# Patient Record
Sex: Female | Born: 1980 | Race: White | Hispanic: No | Marital: Married | Smoking: Never smoker
Health system: Southern US, Community
[De-identification: ages and names within clinical notes are randomized; demographics above are authoritative.]

---

## 2015-12-29 ENCOUNTER — Emergency Department (HOSPITAL_BASED_OUTPATIENT_CLINIC_OR_DEPARTMENT_OTHER)
Admission: EM | Admit: 2015-12-29 | Discharge: 2015-12-29 | Disposition: A | Discharge status: Home / Self Care | Payer: 59 | Attending: Emergency Medicine | Admitting: Emergency Medicine

## 2015-12-29 ENCOUNTER — Emergency Department (HOSPITAL_BASED_OUTPATIENT_CLINIC_OR_DEPARTMENT_OTHER): Payer: 59

## 2015-12-29 ENCOUNTER — Encounter (HOSPITAL_BASED_OUTPATIENT_CLINIC_OR_DEPARTMENT_OTHER): Payer: Self-pay | Admitting: *Deleted

## 2015-12-29 DIAGNOSIS — S62639B Displaced fracture of distal phalanx of unspecified finger, initial encounter for open fracture: Secondary | ICD-10-CM

## 2015-12-29 DIAGNOSIS — Y998 Other external cause status: Secondary | ICD-10-CM | POA: Diagnosis not present

## 2015-12-29 DIAGNOSIS — Y9289 Other specified places as the place of occurrence of the external cause: Secondary | ICD-10-CM | POA: Insufficient documentation

## 2015-12-29 DIAGNOSIS — S62660B Nondisplaced fracture of distal phalanx of right index finger, initial encounter for open fracture: Secondary | ICD-10-CM | POA: Insufficient documentation

## 2015-12-29 DIAGNOSIS — S61310A Laceration without foreign body of right index finger with damage to nail, initial encounter: Secondary | ICD-10-CM | POA: Insufficient documentation

## 2015-12-29 DIAGNOSIS — Y9389 Activity, other specified: Secondary | ICD-10-CM | POA: Diagnosis not present

## 2015-12-29 DIAGNOSIS — S61219A Laceration without foreign body of unspecified finger without damage to nail, initial encounter: Secondary | ICD-10-CM

## 2015-12-29 DIAGNOSIS — W290XXA Contact with powered kitchen appliance, initial encounter: Secondary | ICD-10-CM | POA: Insufficient documentation

## 2015-12-29 DIAGNOSIS — S6991XA Unspecified injury of right wrist, hand and finger(s), initial encounter: Secondary | ICD-10-CM | POA: Diagnosis present

## 2015-12-29 MED ORDER — HYDROCODONE-ACETAMINOPHEN 5-325 MG PO TABS: ORAL_TABLET | ORAL | Status: AC

## 2015-12-29 MED ORDER — LIDOCAINE HCL 2 % IJ SOLN
20.0000 mL | Freq: Once | INTRAMUSCULAR | Status: AC
  Administered 2015-12-29: 400 mg via INTRADERMAL
  Filled 2015-12-29: qty 20

## 2015-12-29 MED ORDER — ONDANSETRON 8 MG PO TBDP
8.0000 mg | ORAL_TABLET | Freq: Once | ORAL | Status: AC
  Administered 2015-12-29: 8 mg via ORAL
  Filled 2015-12-29: qty 1

## 2015-12-29 MED ORDER — CEPHALEXIN 250 MG PO CAPS
500.0000 mg | ORAL_CAPSULE | Freq: Once | ORAL | Status: AC
  Administered 2015-12-29: 500 mg via ORAL
  Filled 2015-12-29: qty 2

## 2015-12-29 MED ORDER — OXYCODONE-ACETAMINOPHEN 5-325 MG PO TABS
1.0000 | ORAL_TABLET | Freq: Once | ORAL | Status: AC
  Administered 2015-12-29: 1 via ORAL
  Filled 2015-12-29: qty 1

## 2015-12-29 MED ORDER — CEPHALEXIN 500 MG PO CAPS: 500.0000 mg | ORAL_CAPSULE | Freq: Four times a day (QID) | ORAL | Status: AC

## 2015-12-29 MED ORDER — HYDROCODONE-ACETAMINOPHEN 5-325 MG PO TABS
1.0000 | ORAL_TABLET | Freq: Once | ORAL | Status: AC
  Administered 2015-12-29: 1 via ORAL
  Filled 2015-12-29: qty 1

## 2015-12-29 NOTE — Discharge Instructions (Signed)
Please read and follow all provided instructions.  Your diagnoses today include:  1. Open fracture of tuft of distal phalanx of finger, initial encounter   2. Laceration of finger, initial encounter    Tests performed today include:  X-ray of the affected area showed a broken bone in the tip of your index finger  Vital signs. See below for your results today.   Medications prescribed:   Keflex (cephalexin) - antibiotic  You have been prescribed an antibiotic medicine: take the entire course of medicine even if you are feeling better. Stopping early can cause the antibiotic not to work.   Vicodin (hydrocodone/acetaminophen) - narcotic pain medication  DO NOT drive or perform any activities that require you to be awake and alert because this medicine can make you drowsy. BE VERY CAREFUL not to take multiple medicines containing Tylenol (also called acetaminophen). Doing so can lead to an overdose which can damage your liver and cause liver failure and possibly death.  Take any prescribed medications only as directed.   Home care instructions:  Follow any educational materials and wound care instructions contained in this packet.   Keep affected area above the level of your heart when possible to minimize swelling. Wash area gently twice a day with warm soapy water. Do not apply alcohol or hydrogen peroxide. Cover the area if it draining or weeping.   Follow-up instructions: Follow-up with Dr. Izora Ribas for a recheck given the severity of your wound.    Return instructions:  Return to the Emergency Department if you have:  Fever  Worsening pain  Worsening swelling of the wound  Pus draining from the wound  Redness of the skin that moves away from the wound, especially if it streaks away from the affected area   Any other emergent concerns  Your vital signs today were: BP 136/91 mmHg   Pulse 100   Temp(Src) 98.6 F (37 C) (Oral)   Resp 18   Ht  (1.575 m)   Wt 77.111 kg    BMI 31.09 kg/m2   SpO2 100%   LMP 12/25/2015 If your blood pressure (BP) was elevated above 135/85 this visit, please have this repeated by your doctor within one month. --------------

## 2015-12-29 NOTE — ED Notes (Signed)
Pt reports she put her hand in blender- laceration to right index and right middle fingertips

## 2015-12-29 NOTE — ED Provider Notes (Addendum)
CSN: [id number]     Arrival date & time 12/29/15  1652 History   First MD Initiated Contact with Patient 12/29/15 1830     Chief Complaint  Patient presents with  . Finger Injury     (Consider location/radiation/quality/duration/timing/severity/associated sxs/prior Treatment) HPI Comments: Patient presents with lacerations to the right index and long fingertips coming approximately 4:15 PM while the patient was using a blender to blend baby food. Wound immediately bled. Wound was covered and patient was transported to the emergency department. No other treatments prior to arrival. Last tetanus update in 2011. Patient denies numbness or tingling. She complains of pain in the area of laceration, especially the index finger where the wound is worse. Onset of symptoms acute. Course is constant. Nothing makes symptoms better or worse.   The history is provided by the patient and a friend.    History reviewed. No pertinent past medical history. Past Surgical History  Procedure Laterality Date  . Cesarean section     No family history on file. Social History  Substance Use Topics  . Smoking status: Never Smoker   . Smokeless tobacco: Never Used  . Alcohol Use: No   OB History    No data available     Review of Systems  Constitutional: Positive for activity change.  Musculoskeletal: Positive for arthralgias. Negative for back pain, joint swelling and neck pain.  Skin: Positive for wound.  Neurological: Negative for weakness and numbness.      Allergies  Review of patient's allergies indicates no known allergies.  Home Medications   Prior to Admission medications   Not on File   BP 136/91 mmHg  Pulse 100  Temp(Src) 98.6 F (37 C) (Oral)  Resp 18  Ht  (1.575 m)  Wt 77.111 kg  BMI 31.09 kg/m2  SpO2 100%  LMP 12/25/2015   Physical Exam  Constitutional: She appears well-developed and well-nourished.  HENT:  Head: Normocephalic and atraumatic.  Eyes: Conjunctivae  are normal.  Neck: Normal range of motion. Neck supple.  Pulmonary/Chest: No respiratory distress.  Musculoskeletal: She exhibits tenderness.       Right wrist: Normal.       Right hand: She exhibits tenderness. She exhibits normal range of motion. Normal strength noted.       Hands: R index finger: Deep laceration noted to the middle of the finger pad, involving part of the nail plate. Mild oozing of blood, wound appears clean. Base explored. Wound extends to bone. Patient has full flexion at the MCP, PIP. She has reasonable ability to flex and good strength with flexion at the DIP as well. No lidocaine observed to be flowing through the wound during digital block. Finger pad distal to the wound appears to be reasonably perfused.   Right long finger: Superficial lacerations to the tip, not requiring repair. Full range of motion of the finger.  Neurological: She is alert.  Skin: Skin is warm and dry.  Psychiatric: She has a normal mood and affect.  Nursing note and vitals reviewed.   ED Course  Procedures (including critical care time) Labs Review Labs Reviewed - No data to display  Imaging Review Dg Hand Complete Right  12/29/2015  CLINICAL DATA:  Laceration to the index and middle fingers, put hand in blander. EXAM: RIGHT HAND - COMPLETE 3+ VIEW COMPARISON:  None. FINDINGS: Nondisplaced fracture of the index finger distal tuft. No additional acute fracture. Probable soft tissue injury to the distal index finger, no radiopaque foreign body.  IMPRESSION: Nondisplaced distal tuft fracture of the index finger with probable associated soft tissue injury. No radiopaque foreign body. Electronically Signed   By: Rubye Oaks M.D.   On: 12/29/2015 18:14   I have personally reviewed and evaluated these images and lab results as part of my medical decision-making.   EKG Interpretation None       Patient seen and examined. Work-up initiated. Medications ordered. X-ray reviewed. Pt updated on  results.   Vital signs reviewed and are as follows: BP 136/91 mmHg  Pulse 100  Temp(Src) 98.6 F (37 C) (Oral)  Resp 18  Ht  (1.575 m)  Wt 77.111 kg  BMI 31.09 kg/m2  SpO2 100%  LMP 12/25/2015   8:08 PM Digital block performed Technique: single injection into base of finger into flexor tendon sheath Finger: R index Area prepped with alcohol wipe and iodine Medication: 3mL of 2% lidocaine without epinephrine   Patient tolerated procedure well. Adequate anesthesia achieved.    LACERATION REPAIR Performed by: Carolee Rota Authorized by: Carolee Rota Consent: Verbal consent obtained. Risks and benefits: risks, benefits and alternatives were discussed Consent given by: patient Patient identity confirmed: provided demographic data Prepped and Draped in normal sterile fashion Wound explored  Laceration Location: R index  Laceration Length: 2cm  No Foreign Bodies seen or palpated  Anesthesia: digital block as above  Irrigation method: skin scrub with dermal cleanser Amount of cleaning: standard  Skin closure: 6-0 Ethilon  Number of sutures: 8  Technique: simple interrupted  Patient tolerance: Patient tolerated the procedure well with no immediate complications.  8:11 PM Patient counseled on wound care. Patient counseled on need to follow-up with orthopedic hand surgery in the next week. Patient was urged to return to the Emergency Department urgently with worsening pain, swelling, expanding erythema especially if it streaks away from the affected area, fever, or if they have any other concerns. Patient verbalized understanding.   Counseled on wound care for long finger as well.  Finger splint placed prior to discharge. Keflex given.  Patient counseled on use of narcotic pain medications. Counseled not to combine these medications with others containing tylenol. Urged not to drink alcohol, drive, or perform any other activities that requires focus while  taking these medications. The patient verbalizes understanding and agrees with the plan.   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 She has tuft fracture associated. Tip of finger appears well perfused. This was repaired without complication. Patient will need orthopedic hand follow-up. Given location and open fracture, patient started on Keflex. Hydrocodone for pain. Finger splint as above.     Renne Crigler, PA-C 12/29/15 2012  Jerelyn Scott, MD 12/29/15 2021  Renne Crigler, PA-C 01/04/16 1029  Jerelyn Scott, MD 01/16/16 [phone]

## 2015-12-29 NOTE — ED Notes (Addendum)
Vitals delayed due to suturing.

## 2015-12-29 NOTE — ED Notes (Signed)
EDPA suturing at BS 

## 2017-04-28 IMAGING — CR DG HAND COMPLETE 3+V*R*
3 series · 3 of 3 positions shown · non-contrast
Comparison: None.

CLINICAL DATA: Laceration to the index and middle fingers, put hand
in blander.

EXAM:
RIGHT HAND - COMPLETE 3+ VIEW

[x hand pa right]
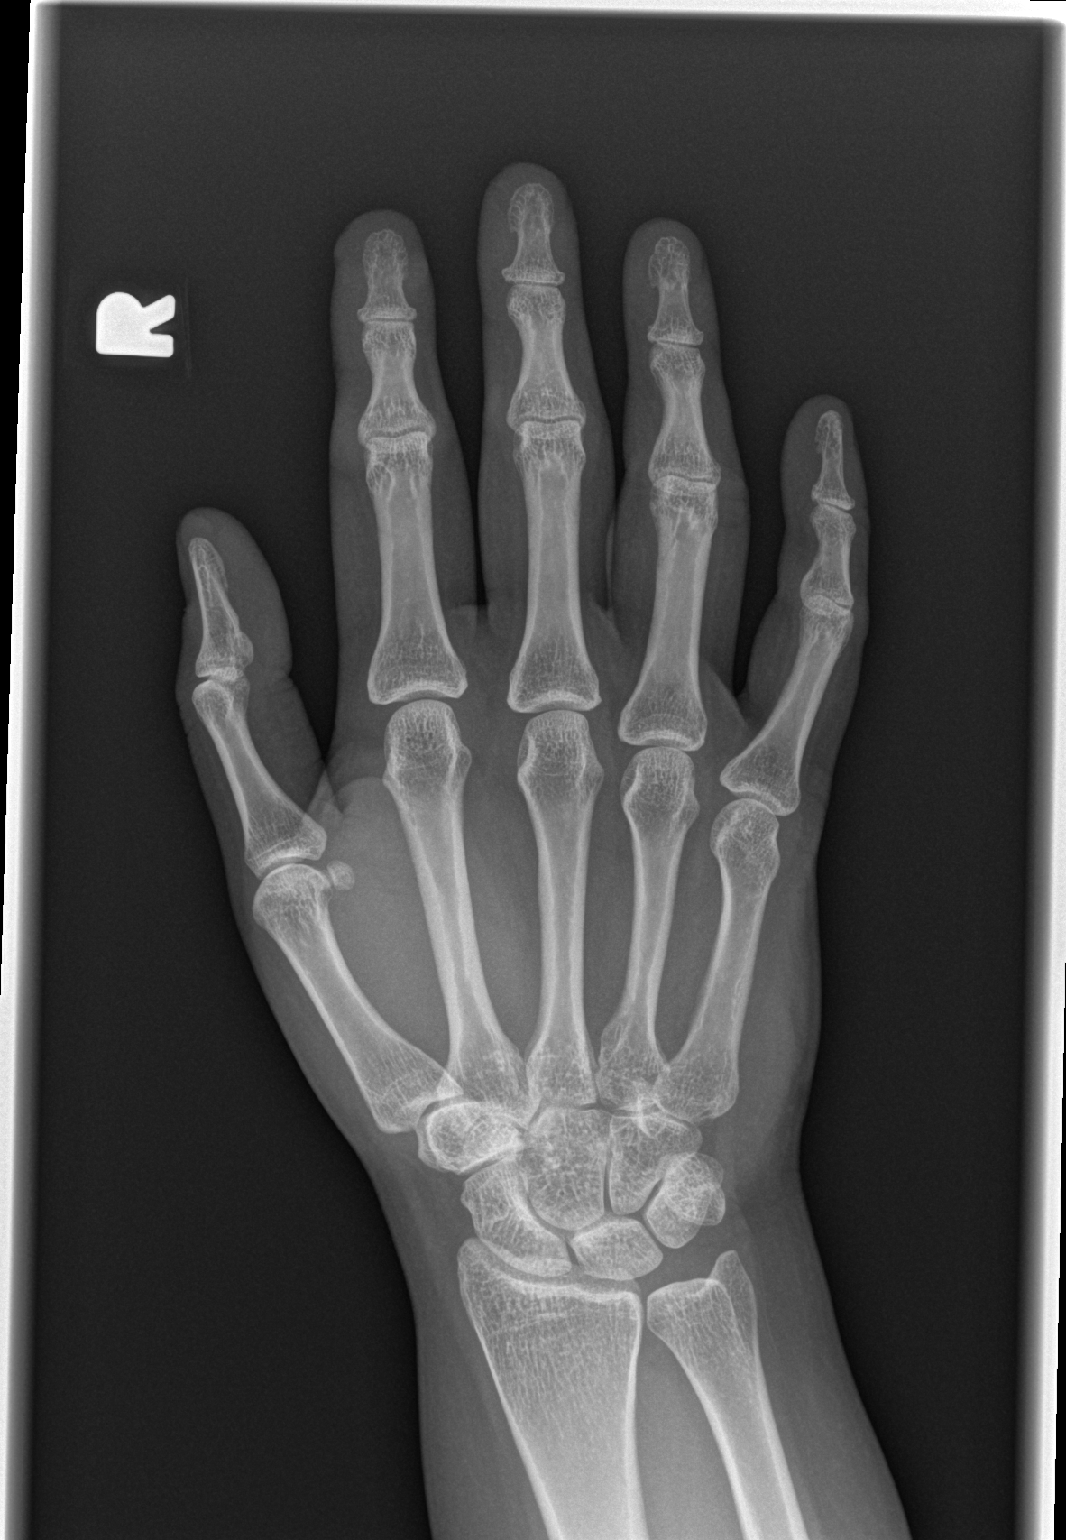

[x hand oblique right]
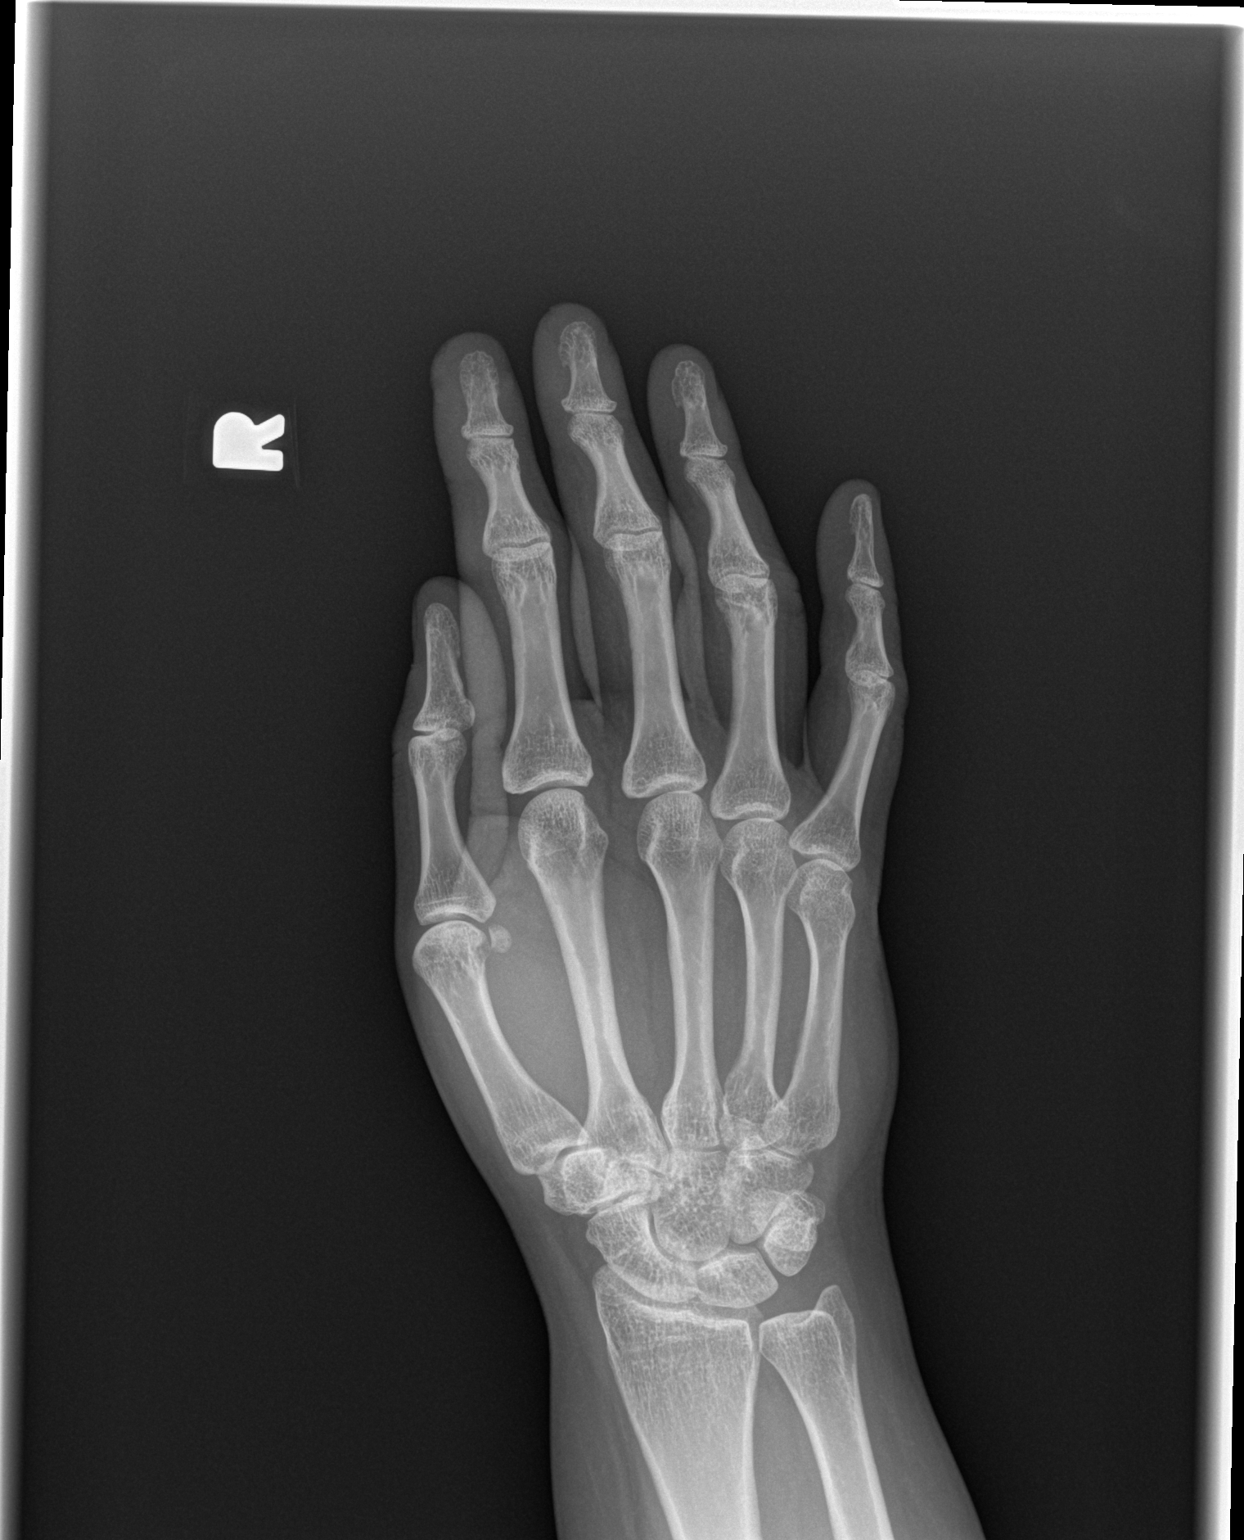

[x hand lat right]
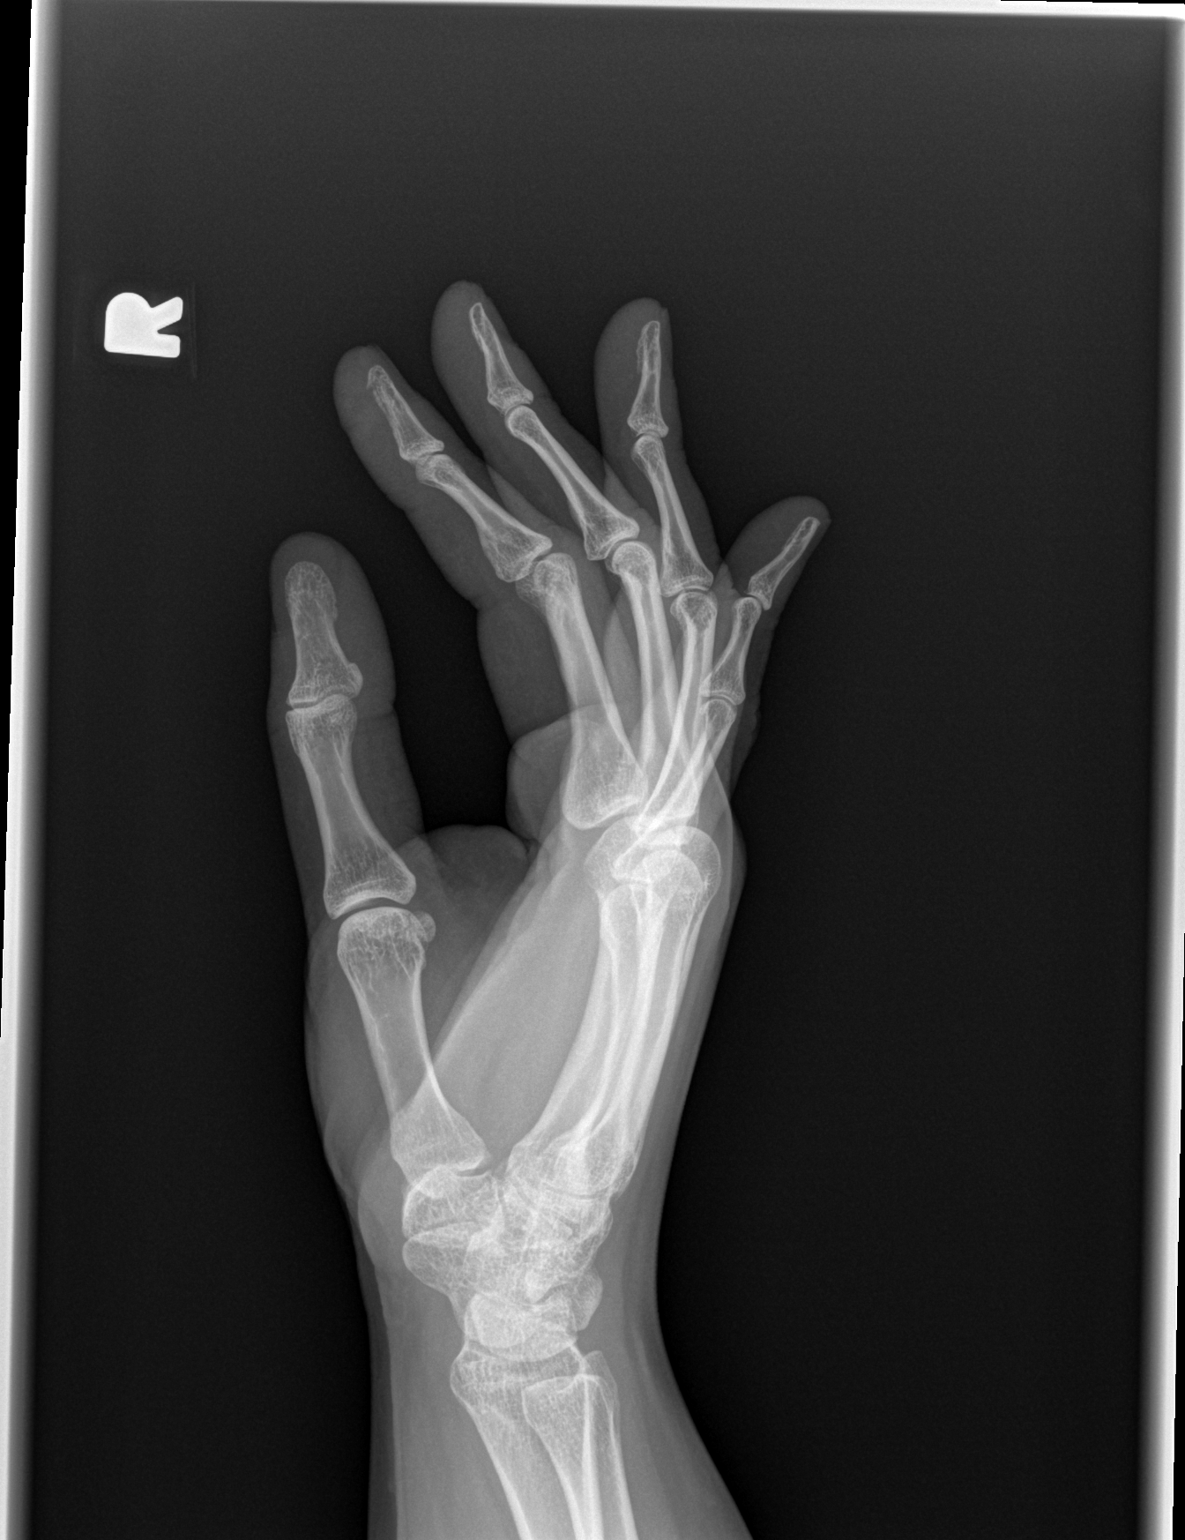

[3 of 3 positions shown; findings below may reference images not displayed]

FINDINGS: Nondisplaced fracture of the index finger distal tuft. No additional
acute fracture. Probable soft tissue injury to the distal index
finger, no radiopaque foreign body.
IMPRESSION: Nondisplaced distal tuft fracture of the index finger with probable
associated soft tissue injury. No radiopaque foreign body.
